# Patient Record
Sex: Male | Born: 1985 | Race: Black or African American | Hispanic: No | Marital: Single | Smoking: Current some day smoker
Health system: Southern US, Community
[De-identification: ages and names within clinical notes are randomized; demographics above are authoritative.]

---

## 2015-12-01 ENCOUNTER — Emergency Department (HOSPITAL_COMMUNITY): Payer: No Typology Code available for payment source

## 2015-12-01 ENCOUNTER — Emergency Department (HOSPITAL_COMMUNITY)
Admission: EM | Admit: 2015-12-01 | Discharge: 2015-12-01 | Discharge status: Against Medical Advice | Payer: No Typology Code available for payment source | Attending: Emergency Medicine | Admitting: Emergency Medicine

## 2015-12-01 ENCOUNTER — Encounter (HOSPITAL_COMMUNITY): Payer: Self-pay | Admitting: *Deleted

## 2015-12-01 DIAGNOSIS — F17200 Nicotine dependence, unspecified, uncomplicated: Secondary | ICD-10-CM | POA: Diagnosis not present

## 2015-12-01 DIAGNOSIS — M25532 Pain in left wrist: Secondary | ICD-10-CM | POA: Insufficient documentation

## 2015-12-01 DIAGNOSIS — Y999 Unspecified external cause status: Secondary | ICD-10-CM | POA: Insufficient documentation

## 2015-12-01 DIAGNOSIS — S80811A Abrasion, right lower leg, initial encounter: Secondary | ICD-10-CM | POA: Insufficient documentation

## 2015-12-01 DIAGNOSIS — Y939 Activity, unspecified: Secondary | ICD-10-CM | POA: Diagnosis not present

## 2015-12-01 DIAGNOSIS — M79632 Pain in left forearm: Secondary | ICD-10-CM | POA: Insufficient documentation

## 2015-12-01 DIAGNOSIS — S20319A Abrasion of unspecified front wall of thorax, initial encounter: Secondary | ICD-10-CM | POA: Diagnosis not present

## 2015-12-01 DIAGNOSIS — Y92410 Unspecified street and highway as the place of occurrence of the external cause: Secondary | ICD-10-CM | POA: Insufficient documentation

## 2015-12-01 DIAGNOSIS — S8991XA Unspecified injury of right lower leg, initial encounter: Secondary | ICD-10-CM | POA: Diagnosis present

## 2015-12-01 LAB — CBC
HCT: 41.5 % (ref 39.0–52.0)
Hemoglobin: 13.8 g/dL (ref 13.0–17.0)
MCH: 27.3 pg (ref 26.0–34.0)
MCHC: 33.3 g/dL (ref 30.0–36.0)
MCV: 82 fL (ref 78.0–100.0)
PLATELETS: 298 10*3/uL (ref 150–400)
RBC: 5.06 MIL/uL (ref 4.22–5.81)
RDW: 14 % (ref 11.5–15.5)
WBC: 10 10*3/uL (ref 4.0–10.5)

## 2015-12-01 LAB — I-STAT CHEM 8, ED
BUN: 12 mg/dL (ref 6–20)
CALCIUM ION: 1.23 mmol/L (ref 1.13–1.30)
CHLORIDE: 105 mmol/L (ref 101–111)
Creatinine, Ser: 1.2 mg/dL (ref 0.61–1.24)
GLUCOSE: 102 mg/dL — AB (ref 65–99)
HCT: 45 % (ref 39.0–52.0)
Hemoglobin: 15.3 g/dL (ref 13.0–17.0)
Potassium: 3.9 mmol/L (ref 3.5–5.1)
Sodium: 141 mmol/L (ref 135–145)
TCO2: 23 mmol/L (ref 0–100)

## 2015-12-01 LAB — ETHANOL: Alcohol, Ethyl (B): 22 mg/dL — ABNORMAL HIGH (ref <5)

## 2015-12-01 LAB — PROTIME-INR
INR: 1.01 (ref 0.00–1.49)
Prothrombin Time: 13.5 seconds (ref 11.6–15.2)

## 2015-12-01 MED ORDER — TETANUS-DIPHTH-ACELL PERTUSSIS 5-2.5-18.5 LF-MCG/0.5 IM SUSP: 0.5000 mL | Freq: Once | INTRAMUSCULAR | Status: DC

## 2015-12-01 NOTE — ED Notes (Signed)
Patient transported to imaging.
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
## 2021-12-26 ENCOUNTER — Encounter: Payer: Self-pay | Admitting: Physician Assistant

## 2021-12-26 ENCOUNTER — Other Ambulatory Visit: Payer: Self-pay | Admitting: Physician Assistant

## 2021-12-26 ENCOUNTER — Ambulatory Visit (INDEPENDENT_AMBULATORY_CARE_PROVIDER_SITE_OTHER): Payer: Commercial Managed Care - HMO | Admitting: Physician Assistant

## 2021-12-26 ENCOUNTER — Telehealth: Payer: Self-pay | Admitting: Physician Assistant

## 2021-12-26 DIAGNOSIS — S83004A Unspecified dislocation of right patella, initial encounter: Secondary | ICD-10-CM | POA: Diagnosis not present

## 2021-12-26 DIAGNOSIS — S83104A Unspecified dislocation of right knee, initial encounter: Secondary | ICD-10-CM | POA: Diagnosis not present

## 2021-12-26 DIAGNOSIS — S83006A Unspecified dislocation of unspecified patella, initial encounter: Secondary | ICD-10-CM | POA: Insufficient documentation

## 2021-12-26 MED ORDER — MELOXICAM 15 MG PO TABS: 15.0000 mg | ORAL_TABLET | Freq: Every day | ORAL | 0 refills | Status: AC

## 2021-12-26 MED ORDER — HYDROCODONE-ACETAMINOPHEN 5-325 MG PO TABS: 1.0000 | ORAL_TABLET | ORAL | 0 refills | Status: DC | PRN

## 2021-12-26 MED ORDER — MELOXICAM 15 MG PO TABS: 15.0000 mg | ORAL_TABLET | Freq: Every day | ORAL | 0 refills | Status: DC
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
## 2022-01-01 ENCOUNTER — Telehealth: Payer: Self-pay | Admitting: Physician Assistant

## 2022-01-01 NOTE — Telephone Encounter (Signed)
Pt states that the 5 mg hydrocodone is not even touching the pain. Can he get something stronger?

## 2022-01-02 ENCOUNTER — Telehealth: Payer: Self-pay | Admitting: *Deleted

## 2022-01-02 NOTE — Telephone Encounter (Signed)
LMOM for patient of the below message  

## 2022-01-02 NOTE — Telephone Encounter (Signed)
Pt wife wants to know if you can give him something stronger for pain- states the hydrocodone is NOT working for him.   Please advise.

## 2022-01-09 ENCOUNTER — Telehealth: Payer: Self-pay | Admitting: Physician Assistant

## 2022-01-09 NOTE — Telephone Encounter (Signed)
Pt wife called and states pt started taking 2 per [facility] and now needing a refill.

## 2022-01-10 ENCOUNTER — Other Ambulatory Visit: Payer: Commercial Managed Care - HMO

## 2022-01-10 ENCOUNTER — Other Ambulatory Visit: Payer: Self-pay | Admitting: Physician Assistant

## 2022-01-10 ENCOUNTER — Telehealth: Payer: Self-pay | Admitting: Physician Assistant

## 2022-01-10 MED ORDER — HYDROCODONE-ACETAMINOPHEN 5-325 MG PO TABS: 1.0000 | ORAL_TABLET | ORAL | 0 refills | Status: DC | PRN

## 2022-01-10 NOTE — Telephone Encounter (Signed)
I called and advised. Rx sent to pharmacy this afternoon.

## 2022-01-10 NOTE — Telephone Encounter (Signed)
Patient's wife called asked if she can get a call when Rx is sent to the pharmacy. Janie Morning said patient is out of his medication. Please see previous note. The number to contact Janie Morning is [phone]

## 2022-01-19 ENCOUNTER — Ambulatory Visit
Admission: RE | Admit: 2022-01-19 | Discharge: 2022-01-19 | Disposition: A | Discharge status: Home / Self Care | Payer: Commercial Managed Care - HMO | Source: Ambulatory Visit | Attending: Physician Assistant | Admitting: Physician Assistant

## 2022-01-19 DIAGNOSIS — S83104A Unspecified dislocation of right knee, initial encounter: Secondary | ICD-10-CM

## 2022-01-22 ENCOUNTER — Encounter: Payer: Self-pay | Admitting: Orthopedic Surgery

## 2022-01-22 ENCOUNTER — Ambulatory Visit: Payer: Commercial Managed Care - HMO | Admitting: Orthopedic Surgery

## 2022-01-22 DIAGNOSIS — S83104A Unspecified dislocation of right knee, initial encounter: Secondary | ICD-10-CM | POA: Diagnosis not present

## 2022-01-22 NOTE — Addendum Note (Signed)
Addended byPrescott Parma on: 01/22/2022 01:41 PM   Modules accepted: Orders

## 2022-01-22 NOTE — Progress Notes (Signed)
Office Visit Note   Patient: Stephen Perez           Date of Birth: 11-Aug-1985           MRN: [id number] Visit Date: 01/22/2022 Requested by: No referring provider defined for this encounter. PCP: Patient, No Pcp Per  Subjective: Chief Complaint  Patient presents with   Right Knee - Pain    HPI: Stephen Perez is a 36 year old patient who sustained knee injury date 123.  This was a patella dislocation while he was playing basketball.  He describes the patella locating back to into place during the follow-up after his injury.  He has been ambulating with a knee immobilizer.  No prior injury or surgery to the right knee; however, he did have left knee patella dislocation in 2012 which resolved with therapy and a period of immobilization.  He has been in a knee immobilizer the past 4 weeks.  Taking Norco meloxicam and Tylenol with marginal pain relief.  Works at [employer] but he has been off of the past 4 weeks.              ROS: All systems reviewed are negative as they relate to the chief complaint within the history of present illness.  Patient denies  fevers or chills.   Assessment & Plan: Visit Diagnoses:  1. Dislocation of right knee, initial encounter     Plan: Impression is right knee patella dislocation with MPFL in continuity as well as chondral defect and lateral meniscal tear on the lateral side.  His patella actually feels pretty reasonable today in terms of stiffness.  I think walking around in the knee immobilizer for the past 4 weeks has been helpful.  Plan at this time is weightbearing as tolerated with crutches and change over to patellar stabilizing brace.  Start physical therapy here 1-2 times a week for 4 weeks for primarily range of motion right lower extremity strengthening.  4-week return with decision for or against surgical intervention at that time.  Patella brace provided along with stockinettes.  Follow-Up Instructions: Return in about 4 weeks (around  02/19/2022).   Orders:  No orders of the defined types were placed in this encounter.  No orders of the defined types were placed in this encounter.     Procedures: No procedures performed   Clinical Data: No additional findings.  Objective: Vital Signs: There were no vitals taken for this visit.  Physical Exam:   Constitutional: Patient appears well-developed HEENT:  Head: Normocephalic Eyes:EOM are normal Neck: Normal range of motion Cardiovascular: Normal rate Pulmonary/chest: Effort normal Neurologic: Patient is alert Skin: Skin is warm Psychiatric: Patient has normal mood and affect   Ortho Exam: Ortho exam demonstrates no effusion in the right knee.  Negative Homans no calf tenderness.  Not able to do a straight leg raise.  Does have some tenderness over the medial epicondyle.  Pedal pulses palpable.  Ankle dorsiflexion intact.  Collateral crucial ligaments are stable.  Specialty Comments:  No specialty comments available.  Imaging: No results found.   PMFS History: Patient Active Problem List   Diagnosis Date Noted   Dislocation, patella closed 12/26/2021   History reviewed. No pertinent past medical history.  History reviewed. No pertinent family history.  History reviewed. No pertinent surgical history. Social History   Occupational History   Not on file  Tobacco Use   Smoking status: Some Days   Smokeless tobacco: Not on file  Substance and Sexual Activity  Alcohol use: Yes   Drug use: No   Sexual activity: Not on file

## 2022-02-10 NOTE — Therapy (Unsigned)
OUTPATIENT PHYSICAL THERAPY LOWER EXTREMITY EVALUATION   Patient Name: Stephen Perez MRN: [id number] DOB:[birth year], 36 y.o., male Today's Date: 02/11/2022   PT End of Session - 02/11/22 1757     Visit Number 1    Number of Visits 8    Date for PT Re-Evaluation 04/08/22    Authorization Type [employer]    PT Start Time 1700    PT Stop Time 1745    PT Time Calculation (min) 45 min    Activity Tolerance Patient limited by pain;Other (comment)   apprehension   Behavior During Therapy WFL for tasks assessed/performed             No past medical history on file. No past surgical history on file. Patient Active Problem List   Diagnosis Date Noted   Dislocation, patella closed 12/26/2021    PCP: Patient, No Pcp Per  REFERRING PROVIDER: Meredith Pel, MD  REFERRING DIAG: [phone] (ICD-10-CM) - Dislocation of right knee, initial encounter   THERAPY DIAG: R patellar dislocation  Rationale for Evaluation and Treatment Rehabilitation  ONSET DATE: 01/23  SUBJECTIVE:   SUBJECTIVE STATEMENT: Injured R knee playing basketball   PERTINENT HISTORY: HPI: Stephen Perez is a 34 year old patient who sustained knee injury date 75.  This was a patella dislocation while he was playing basketball.  He describes the patella locating back to into place during the follow-up after his injury.  He has been ambulating with a knee immobilizer.  No prior injury or surgery to the right knee; however, he did have left knee patella dislocation in 2012 which resolved with therapy and a period of immobilization.  He has been in a knee immobilizer the past 4 weeks.  Taking Norco meloxicam and Tylenol with marginal pain relief.  Works at [employer] but he has been off of the past 4 weeks.   PAIN:  Are you having pain? Yes: NPRS scale: 8/10 Pain location: R knee Pain description: ache  Aggravating factors: bending and prolonged positions Relieving factors: rest  PRECAUTIONS: Other: WBAT with  crutches  WEIGHT BEARING RESTRICTIONS Yes WBAT w/crutches  FALLS:  Has patient fallen in last 6 months? No  LIVING ENVIRONMENT: Lives with: lives with their family   OCCUPATION: factory work  PLOF: Independent  PATIENT GOALS To regain function in my knee   OBJECTIVE:   DIAGNOSTIC FINDINGS: none available  PATIENT SURVEYS:  FOTO 35(65 predicted)  COGNITION:  Overall cognitive status: Within functional limits for tasks assessed     SENSATION: Not tested  MUSCLE LENGTH: Hamstrings: Right 80 deg; Left 80 deg   POSTURE: No Significant postural limitations  PALPATION: TTP R patellar tendon  LOWER EXTREMITY ROM:  Passive ROM Right eval Left eval  Hip flexion    Hip extension    Hip abduction    Hip adduction    Hip internal rotation    Hip external rotation    Knee flexion 10d   Knee extension 0d   Ankle dorsiflexion    Ankle plantarflexion    Ankle inversion    Ankle eversion     (Blank rows = not tested)  LOWER EXTREMITY MMT:  MMT Right eval Left eval  Hip flexion    Hip extension    Hip abduction    Hip adduction    Hip internal rotation    Hip external rotation    Knee flexion 3   Knee extension 2   Ankle dorsiflexion    Ankle plantarflexion    Ankle inversion  Ankle eversion     (Blank rows = not tested)  LOWER EXTREMITY SPECIAL TESTS:  deferred  FUNCTIONAL TESTS:  deferred  GAIT: Distance walked: 42ft x2 Assistive device utilized: None Level of assistance: Modified independence Comments: antalgic gait    TODAY'S TREATMENT: Eval an HEP   PATIENT EDUCATION:  Education details: Discussed eval findings, rehab rationale and POC and patient is in agreement Person educated: Patient Education method: Explanation Education comprehension: verbalized understanding and needs further education   HOME EXERCISE PROGRAM: Access Code: [email or web address] URL: [email or web address] Date: 02/11/2022 Prepared by: Gustavus Bryant  Exercises - Supine Quad Set  - 2 x daily - 5 x weekly - 3 sets - 10 reps - 3s hold - Seated Heel Slide  - 2 x daily - 5 x weekly - 3 sets - 10 reps - 3s hold - Heel Toe Raises with Counter Support  - 2 x daily - 5 x weekly - 3 sets - 10 reps  ASSESSMENT:  CLINICAL IMPRESSION: Patient is a 36 y.o. male who was seen today for physical therapy evaluation and treatment for R knee pain and weakness following a patellar dislocation. Patient demos marked quad weakness and inability to flex knee or perform a SLR.  Patient extremely apprehensive about repeat dislocation.   OBJECTIVE IMPAIRMENTS Abnormal gait, decreased activity tolerance, decreased balance, decreased coordination, decreased endurance, decreased knowledge of condition, decreased knowledge of use of DME, decreased mobility, difficulty walking, decreased ROM, decreased strength, hypomobility, increased edema, and pain.   ACTIVITY LIMITATIONS carrying, lifting, bending, sitting, standing, squatting, stairs, transfers, and bed mobility  REHAB POTENTIAL: Good  CLINICAL DECISION MAKING: Stable/uncomplicated  EVALUATION COMPLEXITY: Low   GOALS: Goals reviewed with patient? No  SHORT TERM GOALS: Target date: 02/25/2022  Patient to demonstrate independence in HEP  Baseline:[id number] Goal status: INITIAL  2.  Increase AROM R knee flexion to 45d Baseline: 10d Goal status: INITIAL  LONG TERM GOALS: Target date: 03/11/2022   Increase R knee AROM to 90d Baseline: 10d Goal status: INITIAL  2.  Patient able to perform SLR on R and maintain TKE Baseline: unable Goal status: INITIAL  3.  Increase FOTO score to 65 Baseline: 35 Goal status: INITIAL  4.  Patient to ambulate 286ft with appropriate gait pattern Baseline: Limited R knee/hip flexion, decreased R stance time and antalgic gait Goal status: INITIAL     PLAN: PT FREQUENCY: 2x/week  PT DURATION: 4 weeks  PLANNED INTERVENTIONS: Therapeutic exercises,  Therapeutic activity, Neuromuscular re-education, Balance training, Gait training, Patient/Family education, Self Care, Joint mobilization, Stair training, Taping, Manual therapy, and Re-evaluation  PLAN FOR NEXT SESSION: HEP review and update, R knee flexion ROM, R quad activation strategies, gait/balance/proprioceptive training   Hildred Laser, PT 02/11/2022, 6:01 PM

## 2022-02-11 ENCOUNTER — Ambulatory Visit: Payer: Commercial Managed Care - HMO | Attending: Orthopedic Surgery

## 2022-02-11 DIAGNOSIS — S83004S Unspecified dislocation of right patella, sequela: Secondary | ICD-10-CM | POA: Insufficient documentation

## 2022-02-11 DIAGNOSIS — M6281 Muscle weakness (generalized): Secondary | ICD-10-CM | POA: Diagnosis present

## 2022-02-11 DIAGNOSIS — M25561 Pain in right knee: Secondary | ICD-10-CM | POA: Diagnosis present

## 2022-02-11 DIAGNOSIS — S83104A Unspecified dislocation of right knee, initial encounter: Secondary | ICD-10-CM | POA: Diagnosis not present

## 2022-02-19 ENCOUNTER — Ambulatory Visit: Payer: Commercial Managed Care - HMO | Admitting: Surgical

## 2022-02-19 ENCOUNTER — Encounter: Payer: Self-pay | Admitting: Orthopedic Surgery

## 2022-02-19 DIAGNOSIS — S83004A Unspecified dislocation of right patella, initial encounter: Secondary | ICD-10-CM | POA: Diagnosis not present

## 2022-02-19 MED ORDER — HYDROCODONE-ACETAMINOPHEN 5-325 MG PO TABS: 1.0000 | ORAL_TABLET | Freq: Two times a day (BID) | ORAL | 0 refills | Status: AC | PRN

## 2022-02-19 NOTE — Progress Notes (Signed)
Follow-up Office Visit Note   Patient: Stephen Perez           Date of Birth: 1986/03/22           MRN: [id number] Visit Date: 02/19/2022 Requested by: No referring provider defined for this encounter. PCP: Patient, No Pcp Per  Subjective: Chief Complaint  Patient presents with   Right Knee - Follow-up, Injury    DOI 12/22/21    HPI: Stephen Perez is a 36 y.o. male who returns to the office for follow-up visit.    Plan at last visit was: Impression is right knee patella dislocation with MPFL in continuity as well as chondral defect and lateral meniscal tear on the lateral side.  His patella actually feels pretty reasonable today in terms of stiffness.  I think walking around in the knee immobilizer for the past 4 weeks has been helpful.  Plan at this time is weightbearing as tolerated with crutches and change over to patellar stabilizing brace.  Start physical therapy here 1-2 times a week for 4 weeks for primarily range of motion right lower extremity strengthening.  4-week return with decision for or against surgical intervention at that time.  Patella brace provided along with stockinettes.  Since then, patient notes he has been working with physical therapy starting last week.  He is only had 1 or 2 sessions.  He is still struggling with lifting his leg and some range of motion.  Knee feels fairly stable.  He has been wearing the patellar stabilizing brace.  Does feel like the patella has subluxed very occasionally maybe 1 or 2 times since last appointment.  Otherwise just complains of medial sided knee pain.  He is ambulatory without crutches.              ROS: All systems reviewed are negative as they relate to the chief complaint within the history of present illness.  Patient denies fevers or chills.  Assessment & Plan: Visit Diagnoses:  1. Closed dislocation of right patella, initial encounter     Plan: Stephen Perez is a 36 y.o. male who returns to the office for  follow-up visit for patellar dislocation on 12/22/2021.  Plan from last visit was noted above in HPI.  They now return with continued quad weakness and stiffness of the knee in flexion.  She started working with physical therapy agreed through physical therapy.  Plan to continue with working to focus on quadricep strengthening and achieving more knee flexion.  Follow-up in 4 weeks for clinical recheck and decision for or against surgical intervention at that time.  Norco was refilled for him 1 last time and that he will have to take over-the-counter medications for pain control.  Follow-Up Instructions: No follow-ups on file.   Orders:  No orders of the defined types were placed in this encounter.  Meds ordered this encounter  Medications   HYDROcodone-acetaminophen (NORCO/VICODIN) 5-325 MG tablet    Sig: Take 1 tablet by mouth every 12 (twelve) hours as needed for moderate pain.    Dispense:  30 tablet    Refill:  0      Procedures: No procedures performed   Clinical Data: No additional findings.  Objective: Vital Signs: There were no vitals taken for this visit.  Physical Exam:  Constitutional: Patient appears well-developed HEENT:  Head: Normocephalic Eyes:EOM are normal Neck: Normal range of motion Cardiovascular: Normal rate Pulmonary/chest: Effort normal Neurologic: Patient is alert Skin: Skin is warm Psychiatric: Patient has normal mood  and affect  Ortho Exam: Ortho exam demonstrates right knee with no effusion.  Patella is stable to lateral translation with fairly solid endpoint and no apprehension.  There is no coarseness or crepitus noted with side-to-side motion of the patella.  Stable on Lachman exam.  Stable to varus and valgus stress at 0 and 30 degrees.  Does have only about 45 degrees of knee flexion.  Not able to perform straight leg raise and has a fairly significant amount of quad atrophy compared with contralateral side.  He is barely able to cause his quad to  fire and really cannot lift his leg at all.  0 degrees extension passively.  Specialty Comments:  No specialty comments available.  Imaging: No results found.   PMFS History: Patient Active Problem List   Diagnosis Date Noted   Dislocation, patella closed 12/26/2021   No past medical history on file.  No family history on file.  No past surgical history on file. Social History   Occupational History   Not on file  Tobacco Use   Smoking status: Some Days   Smokeless tobacco: Not on file  Substance and Sexual Activity   Alcohol use: Yes   Drug use: No   Sexual activity: Not on file

## 2022-02-20 ENCOUNTER — Ambulatory Visit: Payer: Commercial Managed Care - HMO

## 2022-02-20 DIAGNOSIS — S83004S Unspecified dislocation of right patella, sequela: Secondary | ICD-10-CM | POA: Diagnosis not present

## 2022-02-20 DIAGNOSIS — M6281 Muscle weakness (generalized): Secondary | ICD-10-CM

## 2022-02-20 NOTE — Therapy (Signed)
OUTPATIENT PHYSICAL THERAPY TREATMENT NOTE   Patient Name: Stephen Perez MRN: [id number] DOB:[birth year], 36 y.o., male Today's Date: 02/20/2022  PCP: Patient, No Pcp Per REFERRING PROVIDER: Cammy Copa, MD  END OF SESSION:   PT End of Session - 02/20/22 1709     Visit Number 2    Number of Visits 8    Date for PT Re-Evaluation 04/08/22    Authorization Type [employer]    PT Start Time 1708    PT Stop Time 1740    PT Time Calculation (min) 32 min    Activity Tolerance Patient limited by pain;Other (comment)    Behavior During Therapy WFL for tasks assessed/performed             History reviewed. No pertinent past medical history. History reviewed. No pertinent surgical history. Patient Active Problem List   Diagnosis Date Noted   Dislocation, patella closed 12/26/2021    REFERRING DIAG: S83.104A (ICD-10-CM) - Dislocation of right knee, initial encounter   THERAPY DIAG: R patellar dislocation  Rationale for Evaluation and Treatment Rehabilitation  PERTINENT HISTORY: HPI: Stephen Perez is a 33 year old patient who sustained knee injury date 123.  This was a patella dislocation while he was playing basketball.  He describes the patella locating back to into place during the follow-up after his injury.  He has been ambulating with a knee immobilizer.  No prior injury or surgery to the right knee; however, he did have left knee patella dislocation in 2012 which resolved with therapy and a period of immobilization.  He has been in a knee immobilizer the past 4 weeks.  Taking Norco meloxicam and Tylenol with marginal pain relief.  Works at [employer] but he has been off of the past 4 weeks.   PRECAUTIONS: Other: WBAT with crutches  SUBJECTIVE: No change in knee pain or function  PAIN:  Are you having pain? Yes: NPRS scale: 7/10 Pain location: R knee Pain description: ache Aggravating factors: ROM Relieving factors: rest, meds    OBJECTIVE: (objective measures  completed at initial evaluation unless otherwise dated)   DIAGNOSTIC FINDINGS: none available   PATIENT SURVEYS:  FOTO 35(65 predicted)   COGNITION:           Overall cognitive status: Within functional limits for tasks assessed                          SENSATION: Not tested   MUSCLE LENGTH: Hamstrings: Right 80 deg; Left 80 deg     POSTURE: No Significant postural limitations   PALPATION: TTP R patellar tendon   LOWER EXTREMITY ROM:   Passive ROM Right eval Left eval  Hip flexion      Hip extension      Hip abduction      Hip adduction      Hip internal rotation      Hip external rotation      Knee flexion 10d    Knee extension 0d    Ankle dorsiflexion      Ankle plantarflexion      Ankle inversion      Ankle eversion       (Blank rows = not tested)   LOWER EXTREMITY MMT:   MMT Right eval Left eval  Hip flexion      Hip extension      Hip abduction      Hip adduction      Hip internal rotation  Hip external rotation      Knee flexion 3    Knee extension 2    Ankle dorsiflexion      Ankle plantarflexion      Ankle inversion      Ankle eversion       (Blank rows = not tested)   LOWER EXTREMITY SPECIAL TESTS:  deferred   FUNCTIONAL TESTS:  deferred   GAIT: Distance walked: 68ft x2 Assistive device utilized: None Level of assistance: Modified independence Comments: antalgic gait       TODAY'S TREATMENT: [facility] Adult PT Treatment:                                                DATE: 02/20/22 Therapeutic Exercise: R QS's 15x SLR w/strap SAQs (minimal ROM) Supine abduction over slide board 15x Heel slides over slide board 15x w/strap PF against wall  Seated knee flexion on towel 15x      PATIENT EDUCATION:  Education details: Discussed eval findings, rehab rationale and POC and patient is in agreement Person educated: Patient Education method: Explanation Education comprehension: verbalized understanding and needs further  education     HOME EXERCISE PROGRAM: Access Code: [email or web address] URL: [email or web address] Date: 02/11/2022 Prepared by: Sharlynn Oliphant   Exercises - Supine Quad Set  - 2 x daily - 5 x weekly - 3 sets - 10 reps - 3s hold - Seated Heel Slide  - 2 x daily - 5 x weekly - 3 sets - 10 reps - 3s hold - Heel Toe Raises with Counter Support  - 2 x daily - 5 x weekly - 3 sets - 10 reps   ASSESSMENT:   CLINICAL IMPRESSION: Today's session focused on quad activation and facilitation, little quad function observed due to pain levels, patient reluctant to flex R knee due to pain.  Observed to demo a slight increase in quas et forc     OBJECTIVE IMPAIRMENTS Abnormal gait, decreased activity tolerance, decreased balance, decreased coordination, decreased endurance, decreased knowledge of condition, decreased knowledge of use of DME, decreased mobility, difficulty walking, decreased ROM, decreased strength, hypomobility, increased edema, and pain.    ACTIVITY LIMITATIONS carrying, lifting, bending, sitting, standing, squatting, stairs, transfers, and bed mobility   REHAB POTENTIAL: Good   CLINICAL DECISION MAKING: Stable/uncomplicated   EVALUATION COMPLEXITY: Low     GOALS: Goals reviewed with patient? No   SHORT TERM GOALS: Target date: 02/25/2022  Patient to demonstrate independence in HEP  Baseline:[id number] Goal status: INITIAL   2.  Increase AROM R knee flexion to 45d Baseline: 10d Goal status: INITIAL   LONG TERM GOALS: Target date: 03/11/2022    Increase R knee AROM to 90d Baseline: 10d Goal status: INITIAL   2.  Patient able to perform SLR on R and maintain TKE Baseline: unable Goal status: INITIAL   3.  Increase FOTO score to 65 Baseline: 35 Goal status: INITIAL   4.  Patient to ambulate 244ft with appropriate gait pattern Baseline: Limited R knee/hip flexion, decreased R stance time and antalgic gait Goal status: INITIAL         PLAN: PT FREQUENCY:  2x/week   PT DURATION: 4 weeks   PLANNED INTERVENTIONS: Therapeutic exercises, Therapeutic activity, Neuromuscular re-education, Balance training, Gait training, Patient/Family education, Self Care, Joint mobilization, Stair training, Taping, Manual therapy, and Re-evaluation   PLAN FOR NEXT SESSION:  HEP review and update, R knee flexion ROM, R quad activation strategies, gait/balance/proprioceptive training    Hildred Laser, PT 02/20/2022, 5:43 PM

## 2022-02-21 ENCOUNTER — Ambulatory Visit: Payer: Commercial Managed Care - HMO

## 2022-02-21 DIAGNOSIS — M6281 Muscle weakness (generalized): Secondary | ICD-10-CM

## 2022-02-21 DIAGNOSIS — S83004S Unspecified dislocation of right patella, sequela: Secondary | ICD-10-CM

## 2022-02-21 DIAGNOSIS — M25561 Pain in right knee: Secondary | ICD-10-CM

## 2022-02-21 NOTE — Therapy (Addendum)
OUTPATIENT PHYSICAL THERAPY TREATMENT NOTE/DC SUMMARY   Patient Name: Stephen Perez MRN: [id number] DOB:10-03-1985, 36 y.o., male Today's Date: 02/21/2022  PCP: Patient, No Pcp Per REFERRING PROVIDER: Meredith Pel, MD PHYSICAL THERAPY DISCHARGE SUMMARY  Visits from Start of Care: 3  Current functional level related to goals / functional outcomes: UTA   Remaining deficits: ROM, strength and pain   Education / Equipment: HEP   Patient agrees to discharge. Patient goals were partially met. Patient is being discharged due to not returning since the last visit.  END OF SESSION:   PT End of Session - 02/21/22 0809     Visit Number 3    Number of Visits 8    Date for PT Re-Evaluation 04/08/22    Authorization Type [employer]    PT Start Time 0810    PT Stop Time 0840    PT Time Calculation (min) 30 min    Activity Tolerance Patient limited by pain;Other (comment)    Behavior During Therapy WFL for tasks assessed/performed             History reviewed. No pertinent past medical history. History reviewed. No pertinent surgical history. Patient Active Problem List   Diagnosis Date Noted   Dislocation, patella closed 12/26/2021    REFERRING DIAG: S83.104A (ICD-10-CM) - Dislocation of right knee, initial encounter   THERAPY DIAG: R patellar dislocation  Rationale for Evaluation and Treatment Rehabilitation  PERTINENT HISTORY: HPI: Stephen Perez is a 34 year old patient who sustained knee injury date 11.  This was a patella dislocation while he was playing basketball.  He describes the patella locating back to into place during the follow-up after his injury.  He has been ambulating with a knee immobilizer.  No prior injury or surgery to the right knee; however, he did have left knee patella dislocation in 2012 which resolved with therapy and a period of immobilization.  He has been in a knee immobilizer the past 4 weeks.  Taking Norco meloxicam and Tylenol with marginal pain  relief.  Works at [employer] but he has been off of the past 4 weeks.   PRECAUTIONS: Other: WBAT with crutches  SUBJECTIVE: No change in knee pain or function  PAIN:  Are you having pain? Yes: NPRS scale: 7/10 Pain location: R knee Pain description: ache Aggravating factors: ROM Relieving factors: rest, meds    OBJECTIVE: (objective measures completed at initial evaluation unless otherwise dated)   DIAGNOSTIC FINDINGS: none available   PATIENT SURVEYS:  FOTO 35(65 predicted)   COGNITION:           Overall cognitive status: Within functional limits for tasks assessed                          SENSATION: Not tested   MUSCLE LENGTH: Hamstrings: Right 80 deg; Left 80 deg     POSTURE: No Significant postural limitations   PALPATION: TTP R patellar tendon   LOWER EXTREMITY ROM:   Passive ROM Right eval Left eval  Hip flexion      Hip extension      Hip abduction      Hip adduction      Hip internal rotation      Hip external rotation      Knee flexion 10d    Knee extension 0d    Ankle dorsiflexion      Ankle plantarflexion      Ankle inversion      Ankle eversion       (  Blank rows = not tested)   LOWER EXTREMITY MMT:   MMT Right eval Left eval  Hip flexion      Hip extension      Hip abduction      Hip adduction      Hip internal rotation      Hip external rotation      Knee flexion 3    Knee extension 2    Ankle dorsiflexion      Ankle plantarflexion      Ankle inversion      Ankle eversion       (Blank rows = not tested)   LOWER EXTREMITY SPECIAL TESTS:  deferred   FUNCTIONAL TESTS:  deferred   GAIT: Distance walked: 41f x2 Assistive device utilized: None Level of assistance: Modified independence Comments: antalgic gait       TODAY'S TREATMENT: [facility] Adult PT Treatment:                                                DATE: 02/21/22 Therapeutic Exercise: R QS's 15x w/PT feedback SLR w/strap 15x SAQs (minimal ROM) 15x Heel  slides15x PF against wall  Seated knee flexion on towel 15x R hip add/abd 15x TKEs RTB 15x NMR: Retrowalk on TM 10% 0.8 mph RLE only   [facility] Adult PT Treatment:                                                DATE: 02/20/22 Therapeutic Exercise: R QS's 15x SLR w/strap SAQs (minimal ROM) Supine abduction over slide board 15x Heel slides over slide board 15x w/strap PF against wall  Seated knee flexion on towel 15x      PATIENT EDUCATION:  Education details: Discussed eval findings, rehab rationale and POC and patient is in agreement Person educated: Patient Education method: Explanation Education comprehension: verbalized understanding and needs further education     HOME EXERCISE PROGRAM: Access Code: [email or web address]: [email or web address] Date: 02/21/2022 Prepared by: JSharlynn Oliphant Exercises - Supine Quad Set  - 2 x daily - 5 x weekly - 3 sets - 10 reps - 3s hold - Seated Heel Slide  - 2 x daily - 5 x weekly - 3 sets - 10 reps - 3s hold - Heel Toe Raises with Counter Support  - 2 x daily - 5 x weekly - 3 sets - 10 reps - Standing Terminal Knee Extension with Resistance  - 2 x daily - 5 x weekly - 3 sets - 10 reps - Small Range Straight Leg Raise  - 2 x daily - 5 x weekly - 3 sets - 10 reps ASSESSMENT:   CLINICAL IMPRESSION: Slight increase soreness from last session.  Today's session continued to focus on R quad activation and flexion.  Continued apprehension towards ROM and knee flexion and active ROM.  Addedd retrowalk on TM to facilitate quad activation     OBJECTIVE IMPAIRMENTS Abnormal gait, decreased activity tolerance, decreased balance, decreased coordination, decreased endurance, decreased knowledge of condition, decreased knowledge of use of DME, decreased mobility, difficulty walking, decreased ROM, decreased strength, hypomobility, increased edema, and pain.    ACTIVITY LIMITATIONS carrying, lifting, bending, sitting, standing, squatting, stairs,  transfers, and bed mobility  REHAB POTENTIAL: Good   CLINICAL DECISION MAKING: Stable/uncomplicated   EVALUATION COMPLEXITY: Low     GOALS: Goals reviewed with patient? No   SHORT TERM GOALS: Target date: 02/25/2022  Patient to demonstrate independence in HEP  Baseline:[id number] Goal status: INITIAL   2.  Increase AROM R knee flexion to 45d Baseline: 10d Goal status: INITIAL   LONG TERM GOALS: Target date: 03/11/2022    Increase R knee AROM to 90d Baseline: 10d Goal status: INITIAL   2.  Patient able to perform SLR on R and maintain TKE Baseline: unable Goal status: INITIAL   3.  Increase FOTO score to 65 Baseline: 35 Goal status: INITIAL   4.  Patient to ambulate 235f with appropriate gait pattern Baseline: Limited R knee/hip flexion, decreased R stance time and antalgic gait Goal status: INITIAL         PLAN: PT FREQUENCY: 2x/week   PT DURATION: 4 weeks   PLANNED INTERVENTIONS: Therapeutic exercises, Therapeutic activity, Neuromuscular re-education, Balance training, Gait training, Patient/Family education, Self Care, Joint mobilization, Stair training, Taping, Manual therapy, and Re-evaluation   PLAN FOR NEXT SESSION: HEP review and update, R knee flexion ROM, R quad activation strategies, gait/balance/proprioceptive training    JLanice Shirts PT 02/21/2022, 9:01 AM

## 2022-02-25 ENCOUNTER — Ambulatory Visit: Payer: Commercial Managed Care - HMO

## 2022-02-27 ENCOUNTER — Ambulatory Visit: Payer: Commercial Managed Care - HMO

## 2022-03-04 ENCOUNTER — Ambulatory Visit: Payer: Commercial Managed Care - HMO

## 2022-03-06 ENCOUNTER — Ambulatory Visit: Payer: Commercial Managed Care - HMO

## 2022-03-13 ENCOUNTER — Ambulatory Visit: Payer: Commercial Managed Care - HMO

## 2022-03-19 ENCOUNTER — Ambulatory Visit: Payer: Commercial Managed Care - HMO | Admitting: Orthopedic Surgery

## 2022-06-18 ENCOUNTER — Ambulatory Visit (INDEPENDENT_AMBULATORY_CARE_PROVIDER_SITE_OTHER): Payer: BLUE CROSS/BLUE SHIELD | Admitting: Orthopedic Surgery

## 2022-06-18 DIAGNOSIS — S83004A Unspecified dislocation of right patella, initial encounter: Secondary | ICD-10-CM | POA: Diagnosis not present

## 2022-06-19 NOTE — Progress Notes (Signed)
Follow-up Office Visit Note   Patient: Stephen Perez           Date of Birth: 17-Nov-1985           MRN: [id number] Visit Date: 06/18/2022 Requested by: No referring provider defined for this encounter. PCP: Patient, No Pcp Per  Subjective: Chief Complaint  Patient presents with   Right Knee - Follow-up    HPI: Stephen Perez is a 37 y.o. male who returns to the office for follow-up visit.    Plan at last visit was:  Stephen Perez is a 37 y.o. male who returns to the office for follow-up visit for patellar dislocation on 12/22/2021.  Plan from last visit was noted above in HPI.  They now return with continued quad weakness and stiffness of the knee in flexion.  She started working with physical therapy agreed through physical therapy.  Plan to continue with working to focus on quadricep strengthening and achieving more knee flexion.  Follow-up in 4 weeks for clinical recheck and decision for or against surgical intervention at that time.  Norco was refilled for him 1 last time and that he will have to take over-the-counter medications for pain control.   Since then, patient notes he is feeling more and more trusting of his right knee.  He denies any further instability episodes.  He was wearing patellar stabilizing brace but has discontinued this within the last month or 2 as he felt he did not need it any longer.  He denies any mechanical symptoms or feelings of instability.  He injured his knee while playing basketball but he has no return plans for go back to playing pickup.              ROS: All systems reviewed are negative as they relate to the chief complaint within the history of present illness.  Patient denies fevers or chills.  Assessment & Plan: Visit Diagnoses:  1. Closed dislocation of right patella, initial encounter     Plan: Stephen Perez is a 37 y.o. male who returns to the office for follow-up visit for right knee injury.  Plan from last visit was noted above in  HPI.  They now return with significant improvement.  He had patellar dislocation in late July 2023.  No further instability events that he has been able to discontinue patellar stabilizing brace without difficulty.  Not going back to playing basketball which was what brought on his patellar dislocation.  On his last exam about 4 months ago he had significant stiffness of flexion and weakness of his quad.  He has done an excellent job rehabbing his quad strength as well as improving his flexion but he still lacks a fair amount of knee flexion compared with contralateral knee.  He is not really doing any exercises to focus on his knee at this point.  We will set him up for physical therapy upstairs 1 time a week for 6 weeks to focus on achieving full knee flexion and optimizing quad strength given his continued quad atrophy.  Follow-up in 2 months for clinical recheck and likely release unless he has further difficulties.  Follow-up with Dr. Marlou Sa at that time.  Follow-Up Instructions: No follow-ups on file.   Orders:  Orders Placed This Encounter  Procedures   Ambulatory referral to Physical Therapy   No orders of the defined types were placed in this encounter.     Procedures: No procedures performed   Clinical Data: No additional findings.  Objective: Vital Signs: There were no vitals taken for this visit.  Physical Exam:  Constitutional: Patient appears well-developed HEENT:  Head: Normocephalic Eyes:EOM are normal Neck: Normal range of motion Cardiovascular: Normal rate Pulmonary/chest: Effort normal Neurologic: Patient is alert Skin: Skin is warm Psychiatric: Patient has normal mood and affect  Ortho Exam: Ortho exam demonstrates right knee with no effusion.  He has range of motion from 0 degrees extension to 90 degrees of knee flexion.  No tenderness over the medial or lateral joint lines.  No tenderness over the medial aspect of the patella or the MPFL attachment on the femur.   MPFL is stable with solid endpoint at 0 and 30 degrees of flexion.  He has no laxity with stressing ACL by Lachman exam.  Negative anterior and posterior drawer signs.  Able to perform straight leg raise with excellent quad strength and no extensor lag.  Quad strength rated 5/5.  He does have some continued quad atrophy compared to contralateral side.  Specialty Comments:  No specialty comments available.  Imaging: No results found.   PMFS History: Patient Active Problem List   Diagnosis Date Noted   Dislocation, patella closed 12/26/2021   No past medical history on file.  No family history on file.  No past surgical history on file. Social History   Occupational History   Not on file  Tobacco Use   Smoking status: Some Days   Smokeless tobacco: Not on file  Substance and Sexual Activity   Alcohol use: Yes   Drug use: No   Sexual activity: Not on file

## 2022-06-22 ENCOUNTER — Encounter: Payer: Self-pay | Admitting: Orthopedic Surgery

## 2022-07-02 ENCOUNTER — Telehealth: Payer: Self-pay | Admitting: Surgical

## 2022-07-02 DIAGNOSIS — S83004A Unspecified dislocation of right patella, initial encounter: Secondary | ICD-10-CM

## 2022-07-02 NOTE — Telephone Encounter (Signed)
Patient called. He would like his PT referral sent to [facility] [address]  [place], [place] [address]

## 2022-07-02 NOTE — Telephone Encounter (Signed)
Referral entered
